# Patient Record
Sex: Female | Born: 1990
Health system: Southern US, Community
[De-identification: ages and names within clinical notes are randomized; demographics above are authoritative.]

## PROBLEM LIST (undated history)

## (undated) DIAGNOSIS — O139 Gestational [pregnancy-induced] hypertension without significant proteinuria, unspecified trimester: Secondary | ICD-10-CM

## (undated) HISTORY — PX: TONSILLECTOMY: SUR1361

---

## 2020-05-28 ENCOUNTER — Telehealth (HOSPITAL_COMMUNITY): Payer: Self-pay | Admitting: Family Medicine

## 2020-05-28 ENCOUNTER — Other Ambulatory Visit: Payer: Self-pay

## 2020-05-28 ENCOUNTER — Ambulatory Visit (HOSPITAL_COMMUNITY)
Admission: RE | Admit: 2020-05-28 | Discharge: 2020-05-28 | Disposition: A | Discharge status: Home / Self Care | Payer: 59 | Source: Ambulatory Visit | Attending: Family Medicine | Admitting: Family Medicine

## 2020-05-28 ENCOUNTER — Encounter (HOSPITAL_COMMUNITY): Payer: Self-pay

## 2020-05-28 VITALS — BP 143/91 | HR 91 | Temp 98.3°F | Resp 17

## 2020-05-28 DIAGNOSIS — H6991 Unspecified Eustachian tube disorder, right ear: Secondary | ICD-10-CM

## 2020-05-28 DIAGNOSIS — H6981 Other specified disorders of Eustachian tube, right ear: Secondary | ICD-10-CM

## 2020-05-28 DIAGNOSIS — H65191 Other acute nonsuppurative otitis media, right ear: Secondary | ICD-10-CM

## 2020-05-28 HISTORY — DX: Gestational (pregnancy-induced) hypertension without significant proteinuria, unspecified trimester: O13.9

## 2020-05-28 MED ORDER — FLUTICASONE PROPIONATE 50 MCG/ACT NA SUSP: 2.0000 | Freq: Every day | NASAL | 2 refills | Status: DC

## 2020-05-28 MED ORDER — PREDNISONE 10 MG PO TABS: 30.0000 mg | ORAL_TABLET | Freq: Every day | ORAL | 0 refills | Status: AC

## 2020-05-28 MED ORDER — PREDNISONE 10 MG PO TABS: 30.0000 mg | ORAL_TABLET | Freq: Every day | ORAL | 0 refills | Status: DC

## 2020-05-28 MED ORDER — FLUTICASONE PROPIONATE 50 MCG/ACT NA SUSP: 2.0000 | Freq: Every day | NASAL | 2 refills | Status: AC

## 2020-05-28 NOTE — Discharge Instructions (Addendum)
No concerns for infection today.  Treating for eustachian tube dysfunction and inflammation of the ear tubes. Take the prednisone daily with food for 3 days. Flonase nasal spray as prescribed Follow up as needed for continued or worsening symptoms

## 2020-05-28 NOTE — ED Triage Notes (Signed)
Pt c/o right ear pain for approx 1 week.   Denies sore throat, congestion, runny nose, cough, fever, or drainage from ear. Took excedrin this morning for ear pain/HA

## 2020-05-28 NOTE — Telephone Encounter (Signed)
Resent meds to pharmacy per pt request.

## 2020-05-28 NOTE — ED Provider Notes (Signed)
[facility]    CSN: [id number] Arrival date & time: 05/28/20  1145      History   Chief Complaint Chief Complaint  Patient presents with  . Otalgia    HPI Lindsay Cooper is a 29 y.o. female.   Patient is a 29 year old female who presents today with right ear pain for approximate 1 week.  Symptoms have been constant.  She has also had mild headache.  Took Excedrin this morning with some relief.  Denies any associated sore throat, congestion, runny nose, cough, fever.  Mild clear drainage from the ear but no trouble hearing or popping or cracking     Past Medical History:  Diagnosis Date  . Gestational hypertension     There are no problems to display for this patient.   Past Surgical History:  Procedure Laterality Date  . TONSILLECTOMY      OB History   No obstetric history on file.      Home Medications    Prior to Admission medications   Medication Sig Start Date End Date Taking? Authorizing Provider  fluticasone (FLONASE) 50 MCG/ACT nasal spray Place 2 sprays into both nostrils daily. 05/28/20   Dahlia Byes A, NP  predniSONE (DELTASONE) 10 MG tablet Take 3 tablets (30 mg total) by mouth daily for 3 days. 05/28/20 05/31/20  Janace Aris, NP    Family History Family History  Problem Relation Age of Onset  . Hypertension Mother   . Diabetes Mother   . Cancer Father   . Hypertension Father   . Diabetes Father   . Aneurysm Brother     Social History Social History   Tobacco Use  . Smoking status: Never Smoker  . Smokeless tobacco: Never Used  Vaping Use  . Vaping Use: Never used  Substance Use Topics  . Alcohol use: Yes  . Drug use: Never     Allergies   Patient has no known allergies.   Review of Systems Review of Systems   Physical Exam Triage Vital Signs ED Triage Vitals  Enc Vitals Group     BP 05/28/20 1225 (!) 143/91     Pulse Rate 05/28/20 1225 91     Resp 05/28/20 1225 17     Temp 05/28/20 1225 98.3 F (36.8  C)     Temp Source 05/28/20 1225 Oral     SpO2 05/28/20 1225 100 %     Weight --      Height --      Head Circumference --      Peak Flow --      Pain Score 05/28/20 1220 8     Pain Loc --      Pain Edu? --      Excl. in GC? --    No data found.  Updated Vital Signs BP (!) 143/91 (BP Location: Left Arm)   Pulse 91   Temp 98.3 F (36.8 C) (Oral)   Resp 17   LMP 05/23/2020   SpO2 100%   Visual Acuity Right Eye Distance:   Left Eye Distance:   Bilateral Distance:    Right Eye Near:   Left Eye Near:    Bilateral Near:     Physical Exam Vitals and nursing note reviewed.  Constitutional:      General: She is not in acute distress.    Appearance: Normal appearance. She is not ill-appearing, toxic-appearing or diaphoretic.  HENT:     Head: Normocephalic.     Right Ear:  No decreased hearing noted. No laceration, drainage, swelling or tenderness. A middle ear effusion is present. There is no impacted cerumen. No foreign body. No mastoid tenderness. No PE tube. No hemotympanum. Tympanic membrane is not injected, scarred, perforated, erythematous, retracted or bulging.     Nose: Nose normal.  Eyes:     Conjunctiva/sclera: Conjunctivae normal.  Pulmonary:     Effort: Pulmonary effort is normal.  Musculoskeletal:        General: Normal range of motion.     Cervical back: Normal range of motion.  Skin:    General: Skin is warm and dry.     Findings: No rash.  Neurological:     Mental Status: She is alert.  Psychiatric:        Mood and Affect: Mood normal.      UC Treatments / Results  Labs (all labs ordered are listed, but only abnormal results are displayed) Labs Reviewed - No data to display  EKG   Radiology No results found.  Procedures Procedures (including critical care time)  Medications Ordered in UC Medications - No data to display  Initial Impression / Assessment and Plan / UC Course  I have reviewed the triage vital signs and the nursing  notes.  Pertinent labs & imaging results that were available during my care of the patient were reviewed by me and considered in my medical decision making (see chart for details).     Eustachian tube dysfunction and acute effusion of the right ear. No concerns for infection today.  Treating with prednisone daily for 3 days and then will have her do Flonase nasal spray daily Follow up as needed for continued or worsening symptoms  Final Clinical Impressions(s) / UC Diagnoses   Final diagnoses:  Eustachian tube dysfunction, right  Acute effusion of right ear     Discharge Instructions     No concerns for infection today.  Treating for eustachian tube dysfunction and inflammation of the ear tubes. Take the prednisone daily with food for 3 days. Flonase nasal spray as prescribed Follow up as needed for continued or worsening symptoms     ED Prescriptions    Medication Sig Dispense Auth. Provider   predniSONE (DELTASONE) 10 MG tablet Take 3 tablets (30 mg total) by mouth daily for 3 days. 9 tablet Jani Ploeger A, NP   fluticasone (FLONASE) 50 MCG/ACT nasal spray Place 2 sprays into both nostrils daily. 16 g Dahlia Byes A, NP     PDMP not reviewed this encounter.   Janace Aris, NP 05/28/20 1305

## 2020-06-10 DIAGNOSIS — F4323 Adjustment disorder with mixed anxiety and depressed mood: Secondary | ICD-10-CM | POA: Diagnosis not present

## 2020-06-17 DIAGNOSIS — R519 Headache, unspecified: Secondary | ICD-10-CM | POA: Diagnosis not present

## 2020-06-17 DIAGNOSIS — H9202 Otalgia, left ear: Secondary | ICD-10-CM | POA: Diagnosis not present

## 2020-06-23 DIAGNOSIS — F4323 Adjustment disorder with mixed anxiety and depressed mood: Secondary | ICD-10-CM | POA: Diagnosis not present

## 2020-07-16 DIAGNOSIS — F4323 Adjustment disorder with mixed anxiety and depressed mood: Secondary | ICD-10-CM | POA: Diagnosis not present

## 2020-07-24 ENCOUNTER — Other Ambulatory Visit: Payer: 59

## 2020-07-25 ENCOUNTER — Other Ambulatory Visit: Payer: Medicaid Other

## 2020-07-25 DIAGNOSIS — Z20822 Contact with and (suspected) exposure to covid-19: Secondary | ICD-10-CM

## 2020-07-29 LAB — NOVEL CORONAVIRUS, NAA: SARS-CoV-2, NAA: NOT DETECTED

## 2020-08-11 DIAGNOSIS — F4323 Adjustment disorder with mixed anxiety and depressed mood: Secondary | ICD-10-CM | POA: Diagnosis not present

## 2020-10-20 ENCOUNTER — Encounter (HOSPITAL_BASED_OUTPATIENT_CLINIC_OR_DEPARTMENT_OTHER): Payer: Self-pay

## 2020-10-20 ENCOUNTER — Ambulatory Visit (HOSPITAL_BASED_OUTPATIENT_CLINIC_OR_DEPARTMENT_OTHER): Payer: 59 | Admitting: Nurse Practitioner

## 2020-10-20 NOTE — Progress Notes (Signed)
No show for NP appt 10/20/2020 at 0810 with Shawna Clamp, DNP.

## 2020-11-17 ENCOUNTER — Encounter (HOSPITAL_COMMUNITY): Payer: Self-pay

## 2020-11-17 ENCOUNTER — Ambulatory Visit (HOSPITAL_COMMUNITY)
Admission: RE | Admit: 2020-11-17 | Discharge: 2020-11-17 | Disposition: A | Discharge status: Home / Self Care | Payer: Managed Care, Other (non HMO) | Source: Ambulatory Visit | Attending: Emergency Medicine | Admitting: Emergency Medicine

## 2020-11-17 ENCOUNTER — Other Ambulatory Visit: Payer: Self-pay

## 2020-11-17 VITALS — BP 138/82 | HR 89 | Temp 98.7°F | Resp 18

## 2020-11-17 DIAGNOSIS — S80811A Abrasion, right lower leg, initial encounter: Secondary | ICD-10-CM | POA: Diagnosis not present

## 2020-11-17 MED ORDER — BACITRACIN ZINC 500 UNIT/GM EX OINT: 1.0000 "application " | TOPICAL_OINTMENT | Freq: Two times a day (BID) | CUTANEOUS | 0 refills | Status: AC

## 2020-11-17 NOTE — ED Provider Notes (Signed)
[facility]    CSN: [id number] Arrival date & time: 11/17/20  1444      History   Chief Complaint Chief Complaint  Patient presents with  . APPOINTMENT: Wound Check    HPI Lindsay Cooper is a 30 y.o. female.   Patient presents with right ankle wound after dog's leash got wrapped around leg and caused abrasion 2 weeks ago.  Within the last day or 2 area has begun to drain and she has noticed yellow drainage. Cleaning area with peroxide every other day  applying over-the-counter antibiotic ointment and covering as needed.  Denies fevers or chills.  Past Medical History:  Diagnosis Date  . Gestational hypertension     There are no problems to display for this patient.   Past Surgical History:  Procedure Laterality Date  . TONSILLECTOMY      OB History   No obstetric history on file.      Home Medications    Prior to Admission medications   Medication Sig Start Date End Date Taking? Authorizing Provider  bacitracin ointment Apply 1 application topically 2 (two) times daily. 11/17/20  Yes Cyndra Feinberg R, NP  fluticasone (FLONASE) 50 MCG/ACT nasal spray Place 2 sprays into both nostrils daily. 05/28/20   Mardella Layman, MD  predniSONE (DELTASONE) 20 MG tablet  07/29/20   [provider]  SUMAtriptan (IMITREX) 50 MG tablet  07/29/20   [provider]    Family History Family History  Problem Relation Age of Onset  . Hypertension Mother   . Diabetes Mother   . Cancer Father   . Hypertension Father   . Diabetes Father   . Aneurysm Brother     Social History Social History   Tobacco Use  . Smoking status: Never Smoker  . Smokeless tobacco: Never Used  Vaping Use  . Vaping Use: Never used  Substance Use Topics  . Alcohol use: Yes  . Drug use: Never     Allergies   Patient has no known allergies.   Review of Systems Review of Systems  Constitutional: Negative.   Respiratory: Negative.   Cardiovascular: Negative.   Skin:  Positive for wound. Negative for color change, pallor and rash.  Neurological: Negative.      Physical Exam Triage Vital Signs ED Triage Vitals  Enc Vitals Group     BP 11/17/20 1525 138/82     Pulse Rate 11/17/20 1525 89     Resp 11/17/20 1525 18     Temp 11/17/20 1525 98.7 F (37.1 C)     Temp Source 11/17/20 1525 Oral     SpO2 11/17/20 1525 100 %     Weight --      Height --      Head Circumference --      Peak Flow --      Pain Score 11/17/20 1522 6     Pain Loc --      Pain Edu? --      Excl. in GC? --    No data found.  Updated Vital Signs BP 138/82 (BP Location: Right Arm)   Pulse 89   Temp 98.7 F (37.1 C) (Oral)   Resp 18   LMP 10/18/2020   SpO2 100%   Visual Acuity Right Eye Distance:   Left Eye Distance:   Bilateral Distance:    Right Eye Near:   Left Eye Near:    Bilateral Near:     Physical Exam Constitutional:  Appearance: Normal appearance. She is normal weight.  HENT:     Head: Normocephalic.  Pulmonary:     Effort: Pulmonary effort is normal.  Musculoskeletal:        General: Normal range of motion.     Cervical back: Normal range of motion.  Skin:    Comments: 4x4 abrasion, granulation tissue exposed with partial interrupted scabbing on small portion, skin surrounding with is mildly reddened but not streaking,  Neurological:     General: No focal deficit present.     Mental Status: She is alert and oriented to person, place, and time. Mental status is at baseline.  Psychiatric:        Mood and Affect: Mood normal.        Behavior: Behavior normal.        Thought Content: Thought content normal.        Judgment: Judgment normal.      UC Treatments / Results  Labs (all labs ordered are listed, but only abnormal results are displayed) Labs Reviewed - No data to display  EKG   Radiology No results found.  Procedures Procedures (including critical care time)  Medications Ordered in UC Medications - No data to  display  Initial Impression / Assessment and Plan / UC Course  I have reviewed the triage vital signs and the nursing notes.  Pertinent labs & imaging results that were available during my care of the patient were reviewed by me and considered in my medical decision making (see chart for details).  Abrasion of right lower extremity  1. Bacitracin bid with non adhesive bandage over area 2. Advised stopping use of peroxide on site  Final Clinical Impressions(s) / UC Diagnoses   Final diagnoses:  Abrasion of right lower extremity, initial encounter     Discharge Instructions     Can use bacitracin ointment twice a day after cleansing with warm diluted soapy water, cover with band-aid   Stop all use of peroxide on site   Follow up for increased drainage, worsening pain, redness starting to move away from site fever or chills    ED Prescriptions    Medication Sig Dispense Auth. Provider   bacitracin ointment Apply 1 application topically 2 (two) times daily. 120 g Valinda Hoar, NP     PDMP not reviewed this encounter.   Valinda Hoar, NP 11/17/20 1705

## 2020-11-17 NOTE — Discharge Instructions (Addendum)
Can use bacitracin ointment twice a day after cleansing with warm diluted soapy water, cover with band-aid   Stop all use of peroxide on site   Follow up for increased drainage, worsening pain, redness starting to move away from site fever or chills

## 2020-11-17 NOTE — ED Triage Notes (Signed)
Pt presents for wound check of right ankle from her dogs leash causing a burn wound to her ankle area X 1 week ago; pt states the area is throbbing pain and leaking drainage.

## 2020-11-19 ENCOUNTER — Encounter (HOSPITAL_BASED_OUTPATIENT_CLINIC_OR_DEPARTMENT_OTHER): Payer: Self-pay | Admitting: Nurse Practitioner

## 2021-04-15 ENCOUNTER — Other Ambulatory Visit: Payer: Self-pay | Admitting: Family Medicine

## 2021-04-15 DIAGNOSIS — Z139 Encounter for screening, unspecified: Secondary | ICD-10-CM

## 2021-09-10 ENCOUNTER — Other Ambulatory Visit: Payer: Self-pay | Admitting: Family Medicine

## 2021-09-10 ENCOUNTER — Other Ambulatory Visit: Payer: Self-pay | Admitting: Specialist

## 2021-09-10 DIAGNOSIS — G43111 Migraine with aura, intractable, with status migrainosus: Secondary | ICD-10-CM

## 2021-09-11 ENCOUNTER — Ambulatory Visit
Admission: RE | Admit: 2021-09-11 | Discharge: 2021-09-11 | Disposition: A | Discharge status: Home / Self Care | Payer: Managed Care, Other (non HMO) | Source: Ambulatory Visit | Attending: Family Medicine | Admitting: Family Medicine

## 2021-09-11 DIAGNOSIS — G43111 Migraine with aura, intractable, with status migrainosus: Secondary | ICD-10-CM

## 2021-09-14 ENCOUNTER — Other Ambulatory Visit: Payer: Self-pay | Admitting: Family Medicine

## 2021-09-14 DIAGNOSIS — G43111 Migraine with aura, intractable, with status migrainosus: Secondary | ICD-10-CM

## 2021-09-18 ENCOUNTER — Other Ambulatory Visit: Payer: Managed Care, Other (non HMO)

## 2022-07-12 IMAGING — MR MR MRA HEAD W/O CM
1 series · 11 of 48 positions shown · non-contrast
Comparison: None available.

CLINICAL DATA: Initial evaluation for chronic headaches, worse over
past 2 weeks.

EXAM:
MRA HEAD WITHOUT CONTRAST
TECHNIQUE: Angiographic images of the Circle of Willis were acquired using MRA
technique without intravenous contrast.

[Series 6: [id number] · axial · 0.6mm · 0.26mm/px · z∈[-68,+17]mm · 11 of 162 slices shown]
[im 11/162]
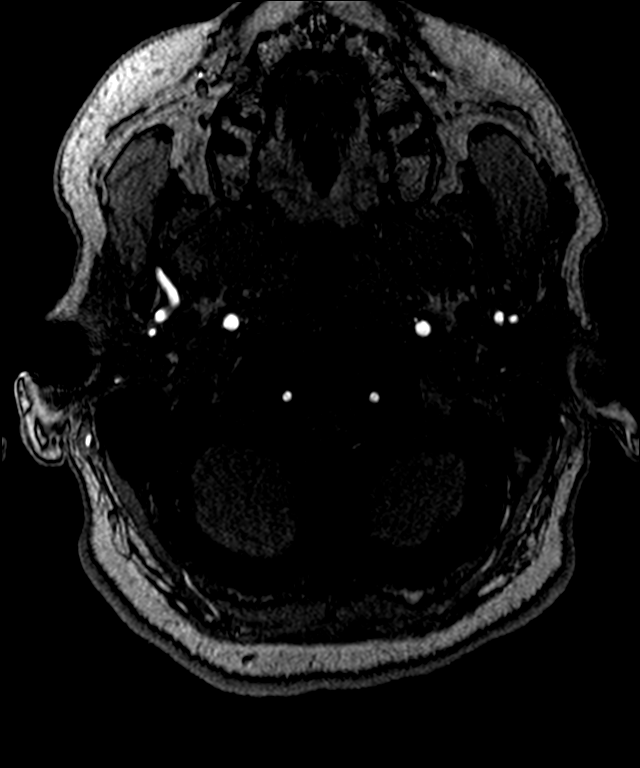
[im 28/162]
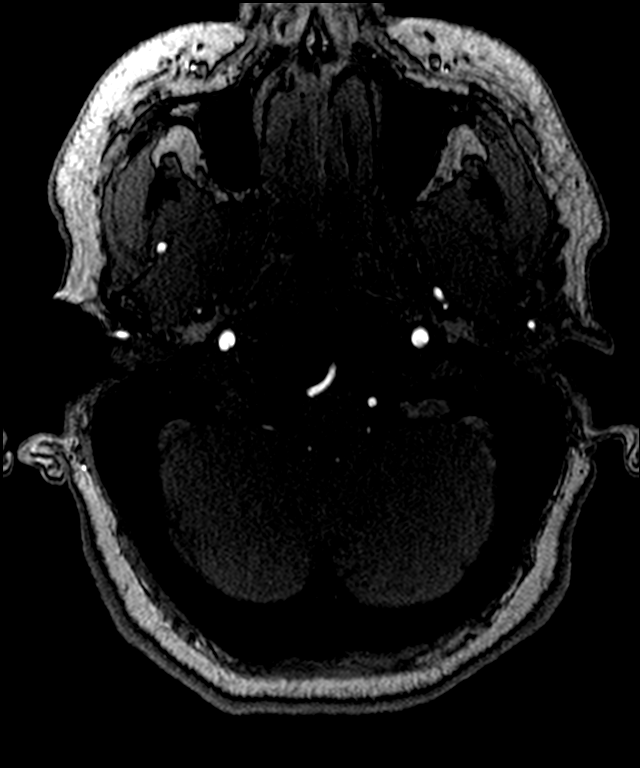
[im 31/162]
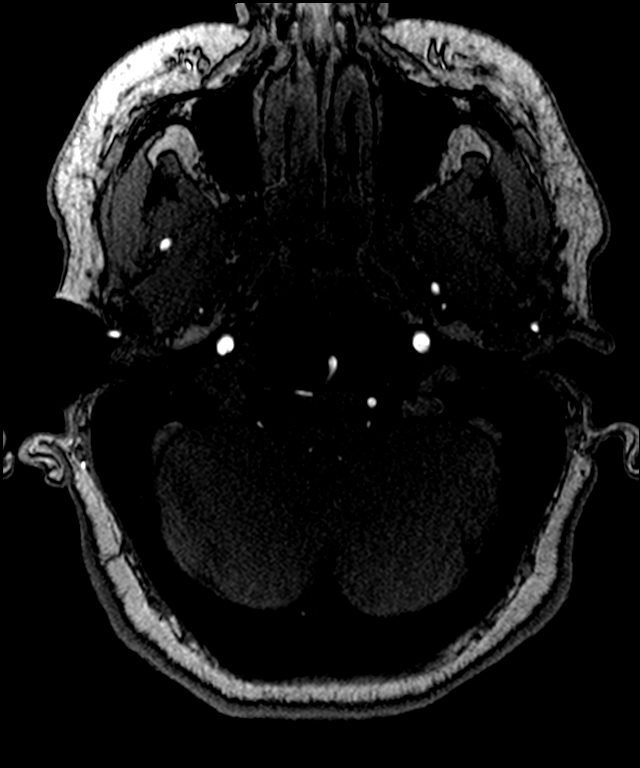
[im 52/162]
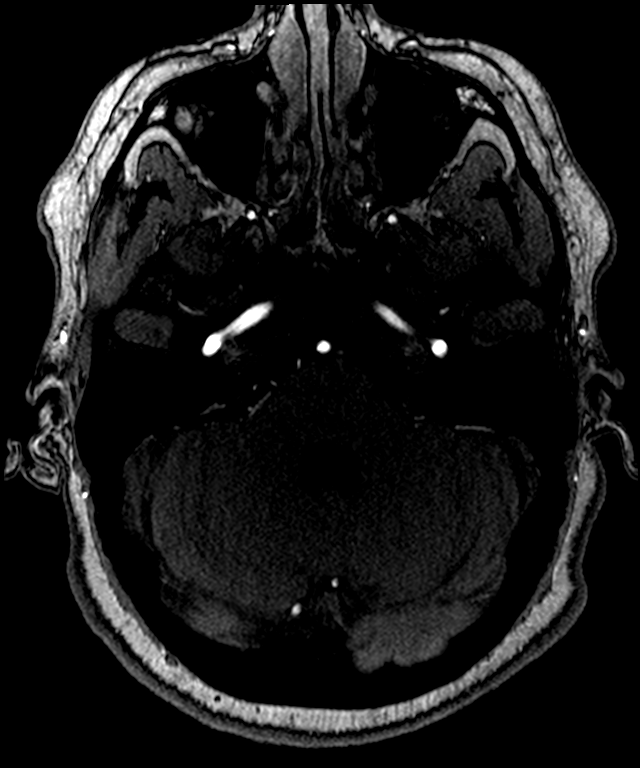
[im 72/162]
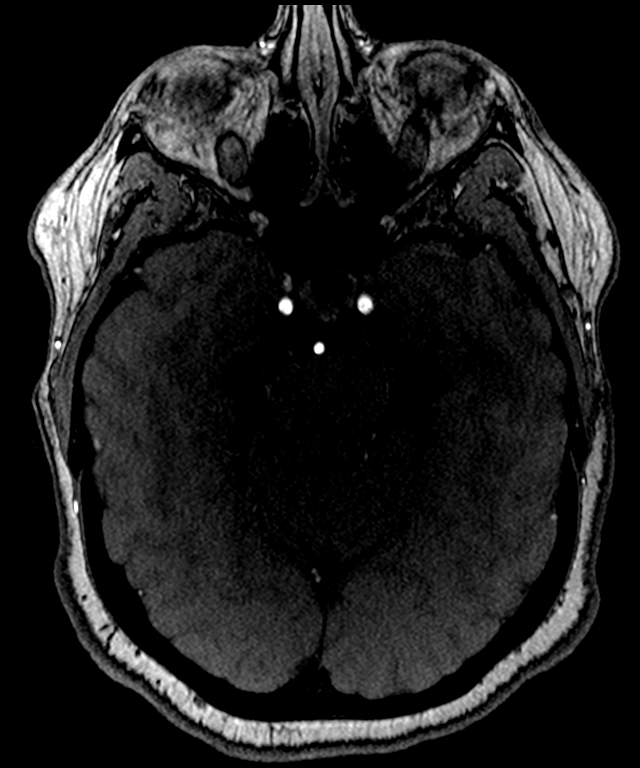
[im 83/162]
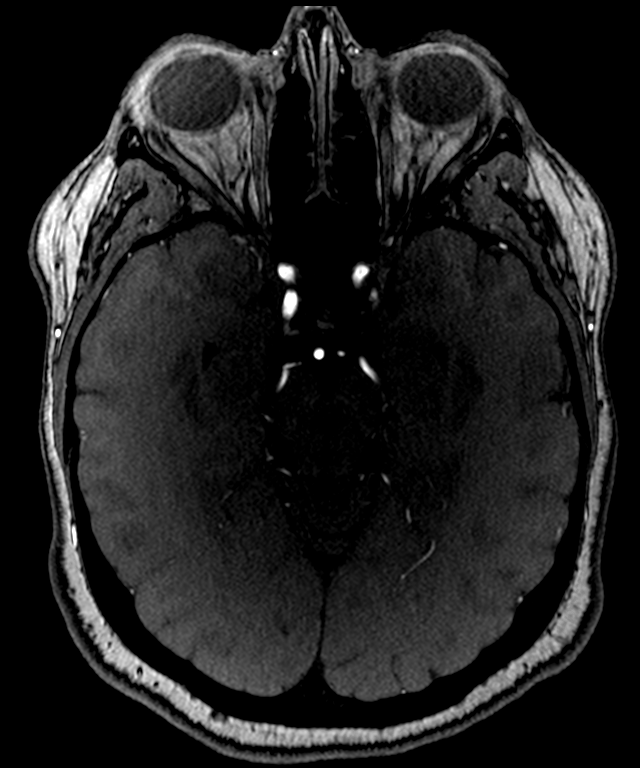
[im 93/162]
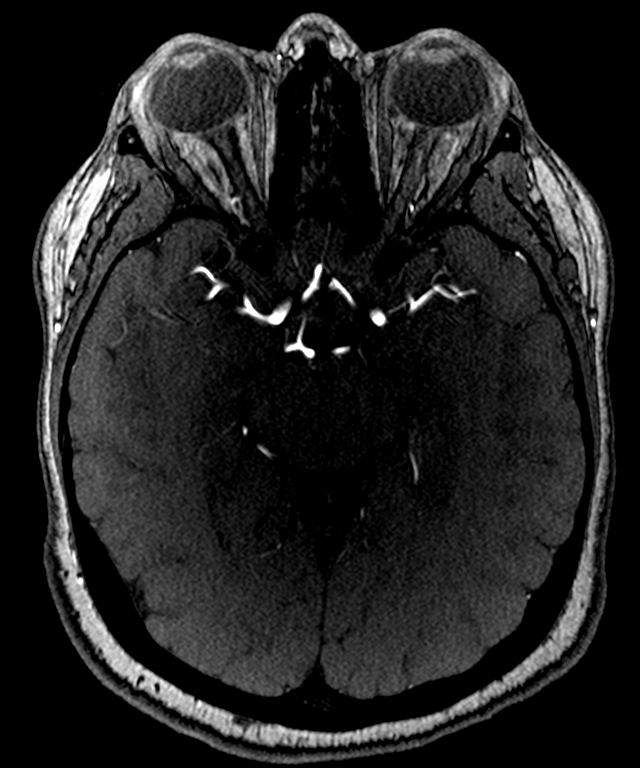
[im 114/162]
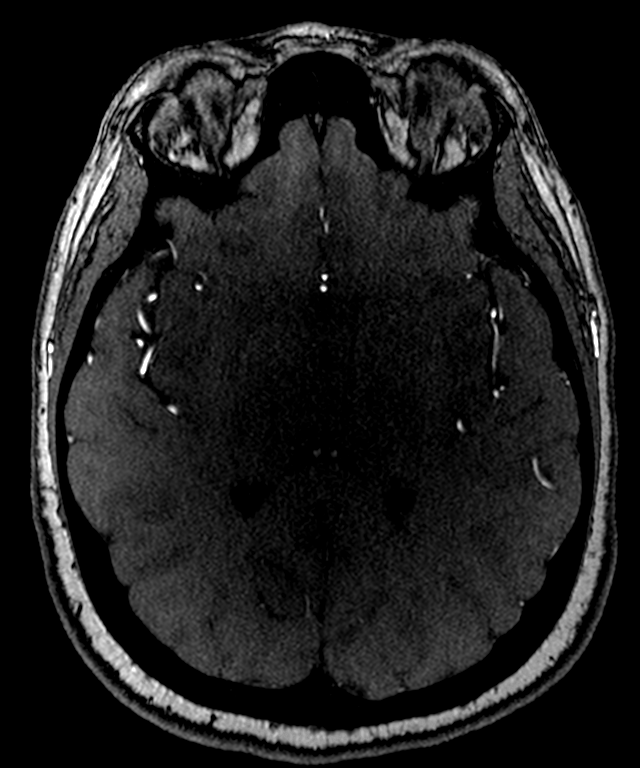
[im 134/162]
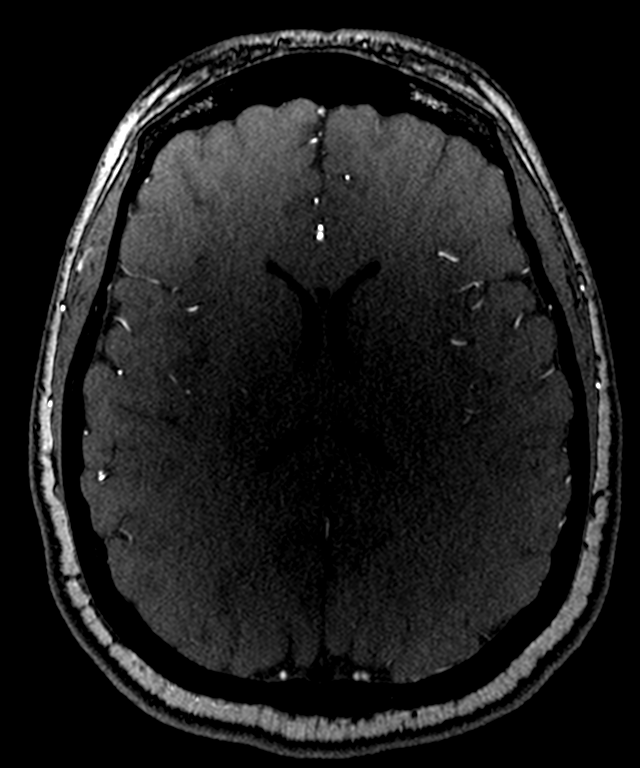
[im 138/162]
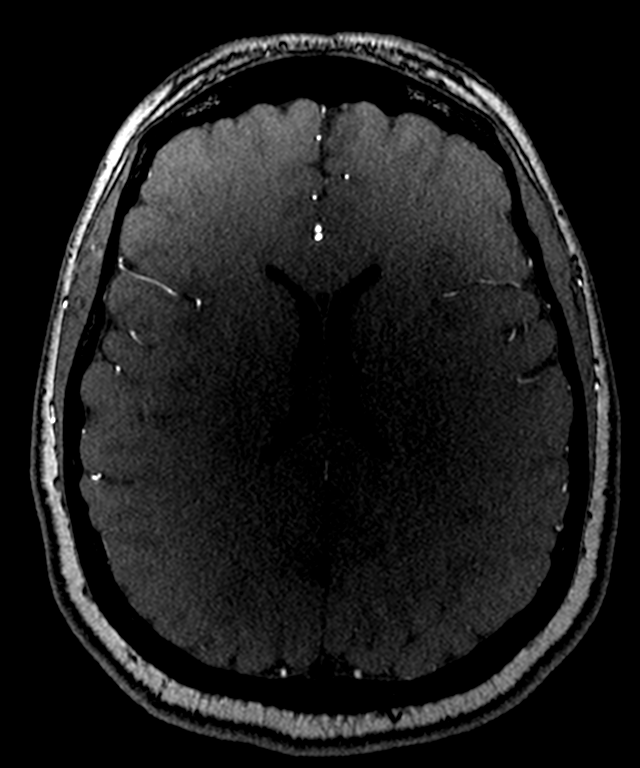
[im 155/162]
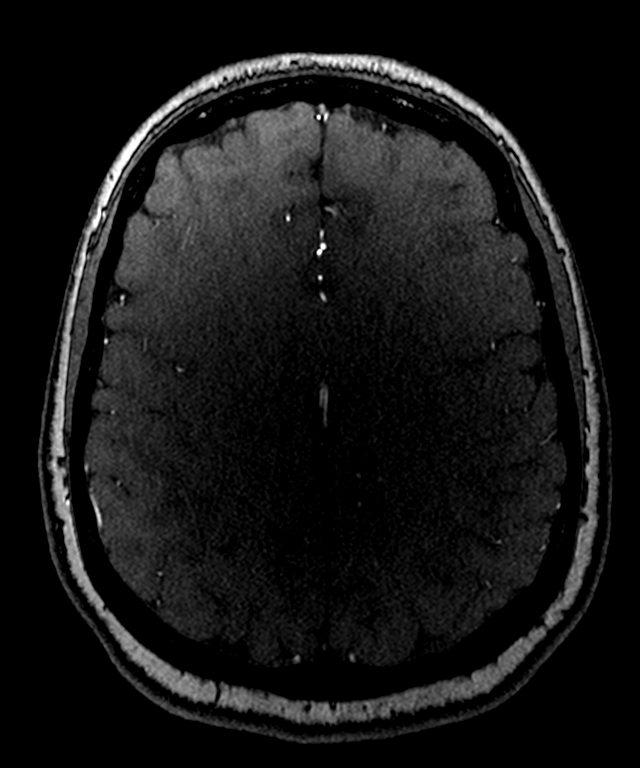

[11 of 48 positions shown; findings below may reference images not displayed]

FINDINGS: Anterior circulation: Visualized distal cervical segments of the
internal carotid arteries are widely patent with antegrade flow.
Petrous, cavernous, and supraclinoid segments patent without
stenosis or other abnormality. Tiny outpouching arising from the
cavernous right ICA favored to reflect a small vascular infundibulum
(series 6, image 76). A1 segments patent bilaterally. Normal
anterior communicating artery complex. Anterior cerebral arteries
widely patent without stenosis. No M1 stenosis or occlusion. Normal
MCA bifurcations. Distal MCA branches perfused and symmetric.

Posterior circulation: Both V4 segments patent without stenosis.
Both PICA patent at their origins. Basilar patent to its distal
aspect without stenosis. Superior cerebellar arteries patent
bilaterally. Both PCAs supplied via the basilar as well as small
bilateral posterior communicating arteries. PCAs well perfused or
distal aspects.

Anatomic variants: None significant.  No intracranial aneurysm.

Other: Empty sella noted.
IMPRESSION: 1. Normal intracranial MRA. No intracranial aneurysm.
2. Empty sella. While this finding is often incidental in nature and
of no clinical significance, this can also be seen in the setting of
idiopathic intracranial hypertension.

## 2022-09-01 ENCOUNTER — Ambulatory Visit: Payer: Managed Care, Other (non HMO) | Admitting: Psychiatry
